# Patient Record
Sex: Female | Born: 1990 | Race: Black or African American | Hispanic: No | Marital: Single | Smoking: Never smoker
Health system: Southern US, Community
[De-identification: ages and names within clinical notes are randomized; demographics above are authoritative.]

---

## 2018-10-04 ENCOUNTER — Ambulatory Visit
Admission: RE | Admit: 2018-10-04 | Discharge: 2018-10-04 | Disposition: A | Discharge status: Home / Self Care | Payer: BC Managed Care – PPO | Source: Ambulatory Visit | Attending: Family Medicine | Admitting: Family Medicine

## 2018-10-04 ENCOUNTER — Other Ambulatory Visit: Payer: Self-pay | Admitting: Family Medicine

## 2018-10-04 DIAGNOSIS — M542 Cervicalgia: Secondary | ICD-10-CM

## 2019-12-26 ENCOUNTER — Other Ambulatory Visit: Payer: Self-pay | Admitting: Neurology

## 2019-12-26 DIAGNOSIS — R4189 Other symptoms and signs involving cognitive functions and awareness: Secondary | ICD-10-CM

## 2019-12-31 ENCOUNTER — Other Ambulatory Visit: Payer: Self-pay

## 2019-12-31 ENCOUNTER — Ambulatory Visit
Admission: RE | Admit: 2019-12-31 | Discharge: 2019-12-31 | Disposition: A | Discharge status: Home / Self Care | Payer: BC Managed Care – PPO | Source: Ambulatory Visit | Attending: Neurology | Admitting: Neurology

## 2019-12-31 DIAGNOSIS — R4189 Other symptoms and signs involving cognitive functions and awareness: Secondary | ICD-10-CM | POA: Diagnosis present

## 2020-06-19 IMAGING — MR MR HEAD W/O CM
8 of 12 series · 27 of 48 positions shown · non-contrast
Comparison: No pertinent prior studies available for comparison.

CLINICAL DATA: Fei of altered cognition. Additional history
provided: Patient reports spells where she "zones out." Posterior
right side headache, symptoms for about 1 year.

EXAM:
MRI HEAD WITHOUT CONTRAST
TECHNIQUE: Multiplanar, multiecho pulse sequences of the brain and surrounding
structures were obtained without intravenous contrast.

[Series 5: T1 · sagittal · 5.0mm · 0.62mm/px · 1 of 21 slices shown (1 of 2)]
[im 1/21]
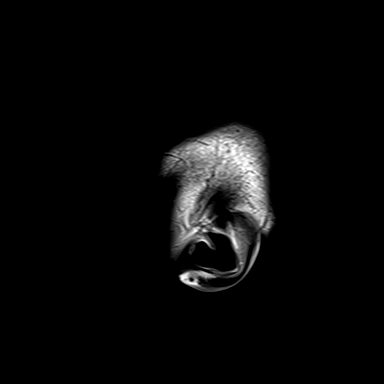

[Series 6: ax dwi_tracew · axial · 3.0mm · 0.60mm/px · z∈[-53,+96]mm · 3 of 48 slices shown]
[im 1/48]
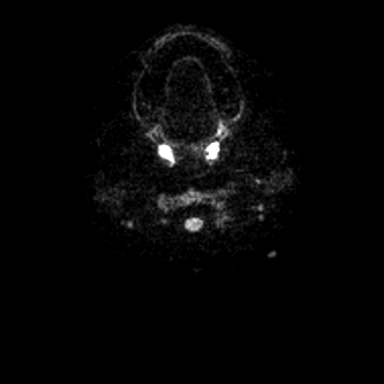
[im 24/48]
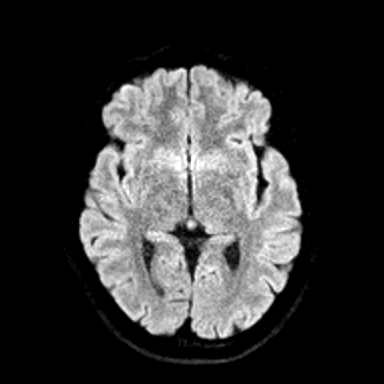
[im 48/48]
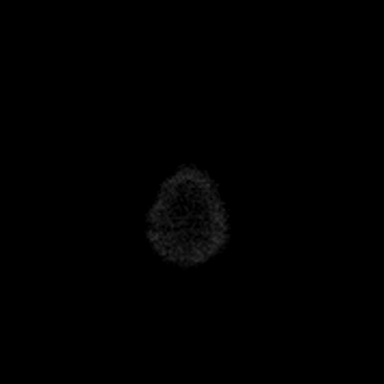

[Series 7: ax dwi_adc · axial · 3.0mm · 0.60mm/px · z∈[-53,+45]mm · 3 of 48 slices shown]
[im 1/48]
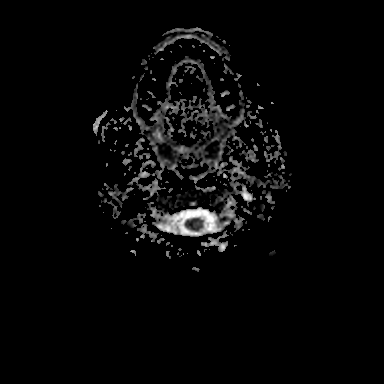
[im 16/48]
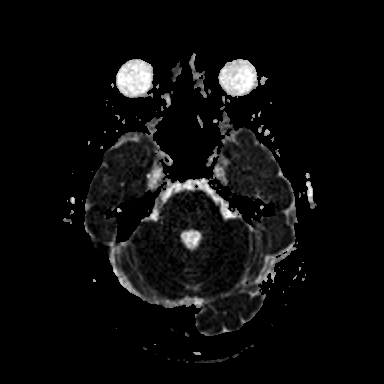
[im 32/48]
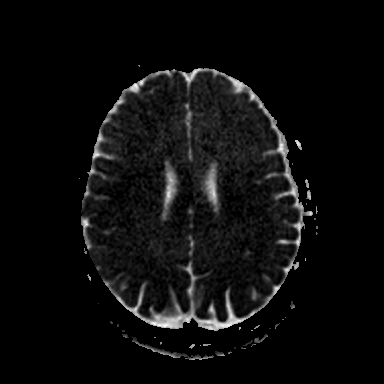

[Series 10: T2 · axial · 5.0mm · 0.53mm/px · z∈[-51,+98]mm · 2 of 27 slices shown (1 of 2)]
[im 1/27]
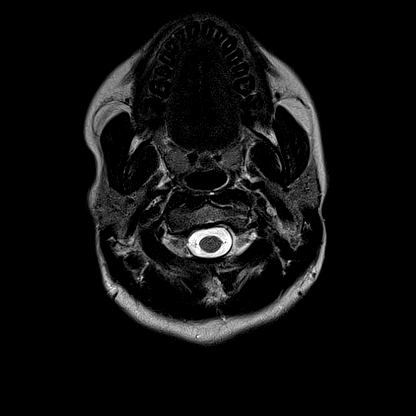
[im 27/27]
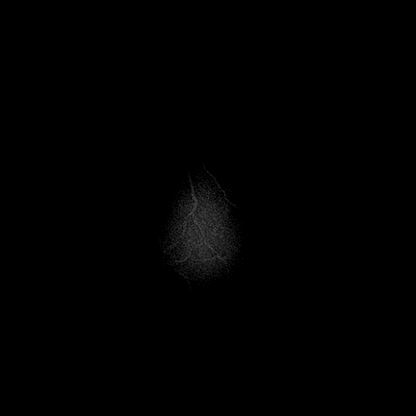

[Series 15: FLAIR · axial · 3.0mm · 0.53mm/px · z∈[-57,+98]mm · 4 of 55 slices shown (1 of 2)]
[im 1/55]
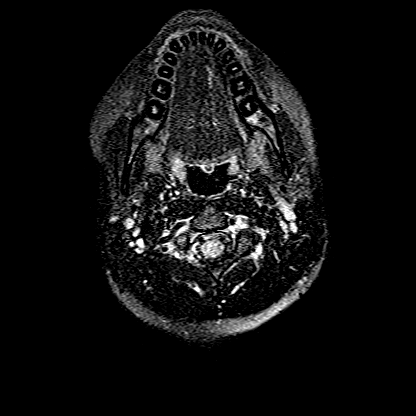
[im 19/55]
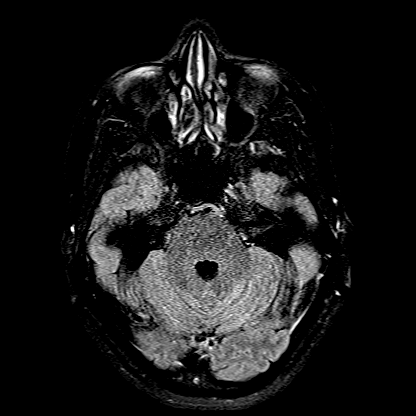
[im 37/55]
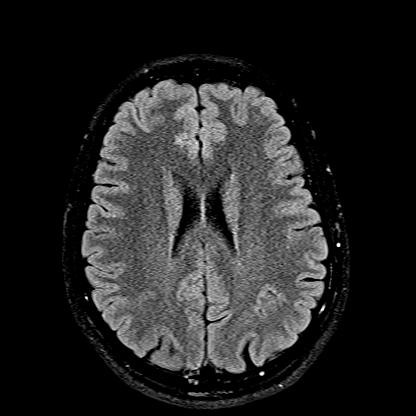
[im 55/55]
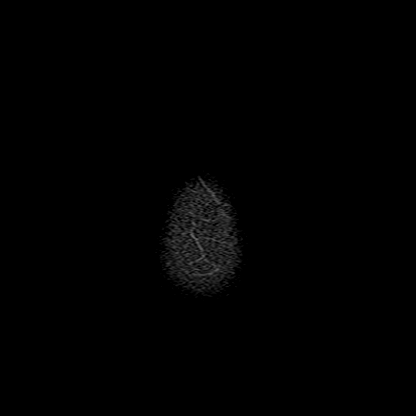

[Series 16: T1 · axial · 1.0mm · 0.98mm/px · z∈[-62,+104]mm · 8 of 174 slices shown (2 of 2)]
[im 1/174]
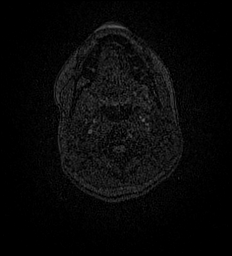
[im 29/174]
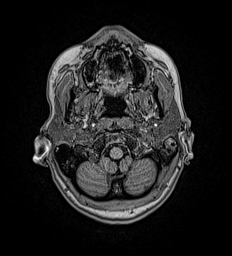
[im 58/174]
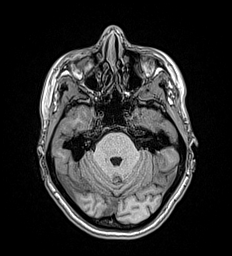
[im 73/174]
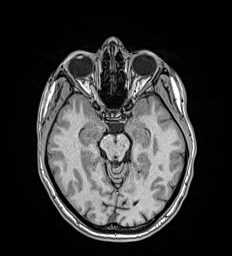
[im 101/174]
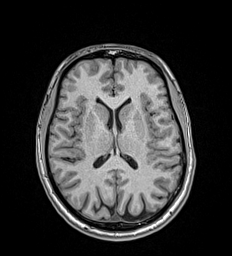
[im 116/174]
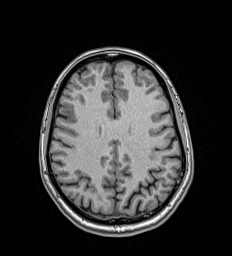
[im 145/174]
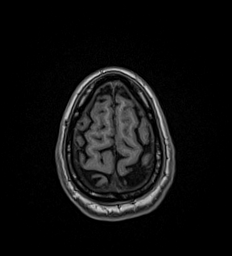
[im 174/174]
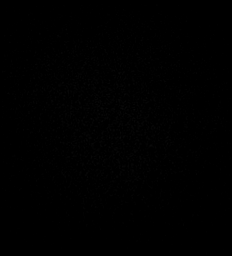

[Series 17: T2 · coronal · 3.0mm · 0.23mm/px · 3 of 35 slices shown (2 of 2)]
[im 1/35]
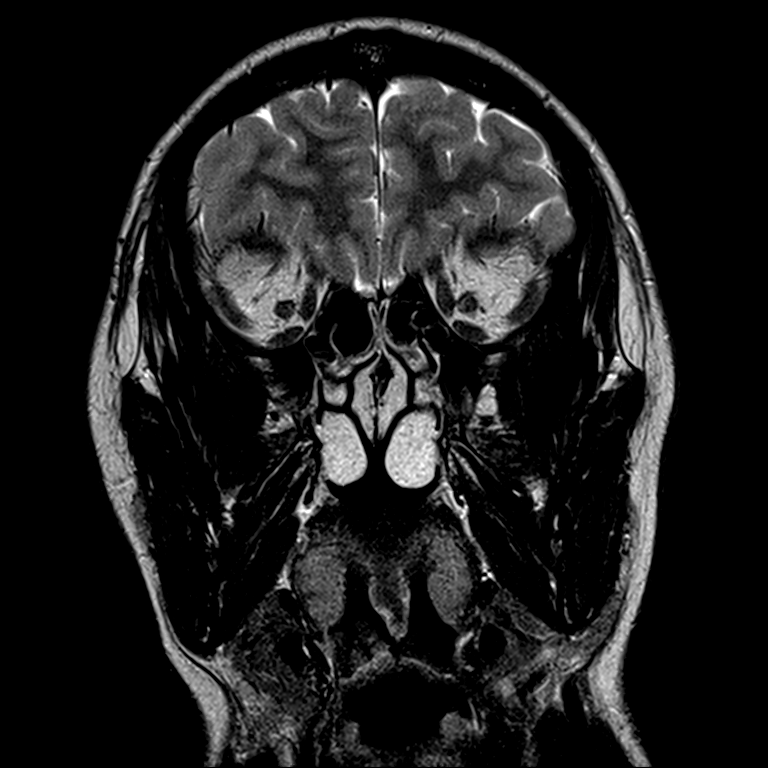
[im 18/35]
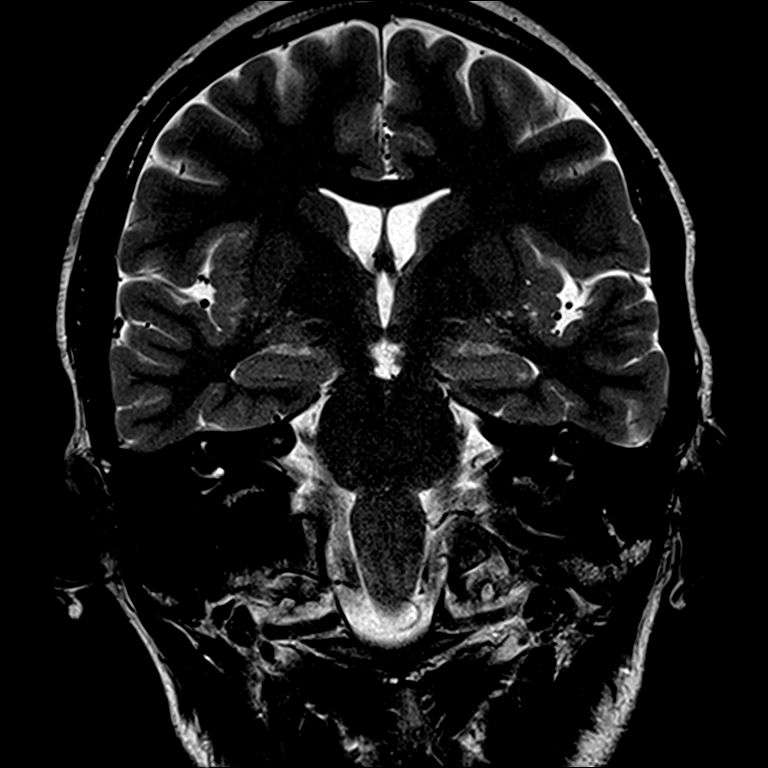
[im 35/35]
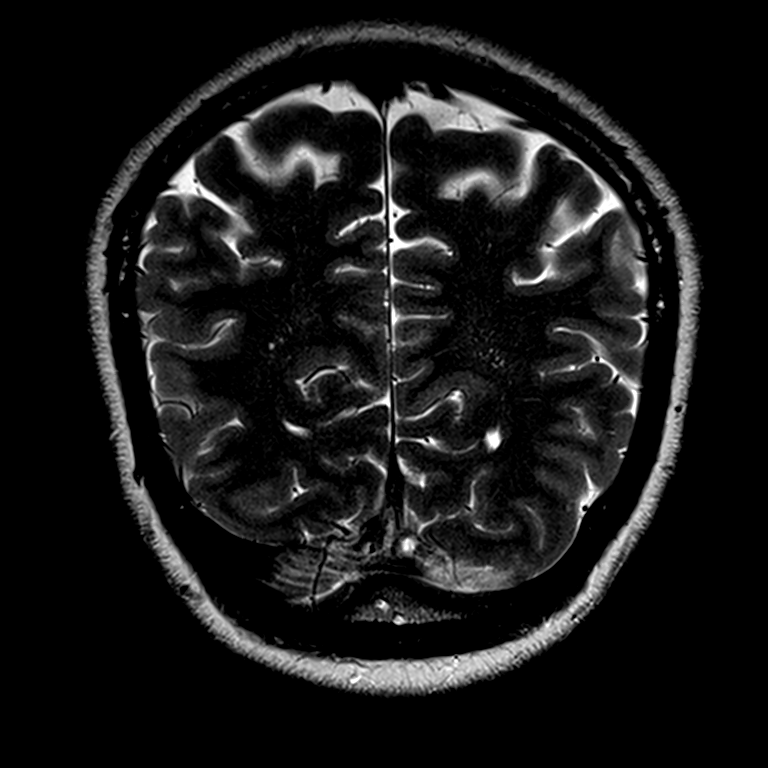

[Series 18: FLAIR · coronal · 3.0mm · 0.35mm/px · 3 of 35 slices shown (2 of 2)]
[im 1/35]
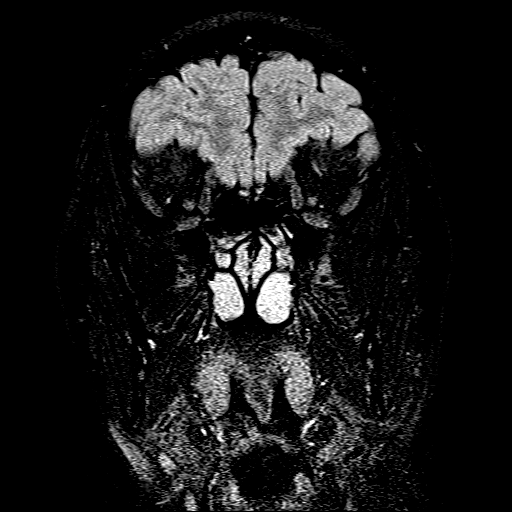
[im 18/35]
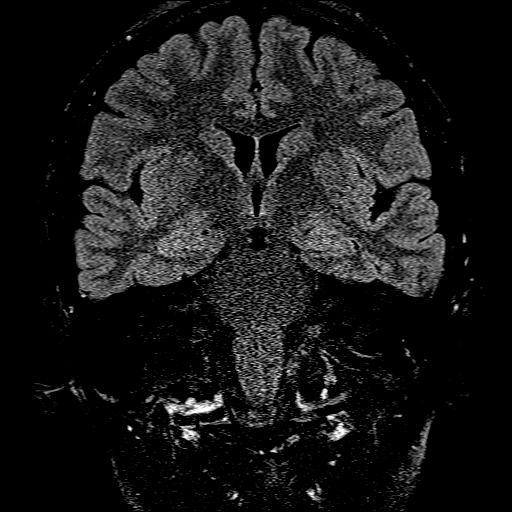
[im 35/35]
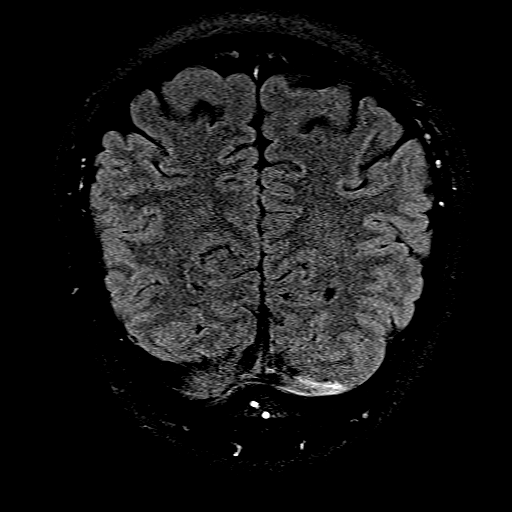

[27 of 48 positions shown; findings below may reference images not displayed]

FINDINGS: Brain:

There is no evidence of acute infarct.

No evidence of intracranial mass.

No midline shift or extra-axial fluid collection.

No chronic intracranial blood products.

No focal parenchymal signal abnormality is identified. The
hippocampi are symmetric in size and signal.

Cerebral volume is normal for age.

Vascular: Flow voids maintained within the proximal large arterial
vessels.

Skull and upper cervical spine: No focal marrow lesion

Sinuses/Orbits: Visualized orbits demonstrate no acute abnormality.
Minimal ethmoid sinus mucosal thickening. No significant mastoid
effusion.
IMPRESSION: Normal MRI appearance of the brain.

No evidence of acute intracranial abnormality.

No specific seizure focus is identified.

## 2021-11-17 ENCOUNTER — Encounter: Payer: Self-pay | Admitting: Emergency Medicine

## 2021-11-17 ENCOUNTER — Emergency Department
Admission: EM | Admit: 2021-11-17 | Discharge: 2021-11-17 | Disposition: A | Discharge status: Home / Self Care | Payer: BC Managed Care – PPO | Attending: Emergency Medicine | Admitting: Emergency Medicine

## 2021-11-17 ENCOUNTER — Emergency Department: Payer: BC Managed Care – PPO

## 2021-11-17 ENCOUNTER — Other Ambulatory Visit: Payer: Self-pay

## 2021-11-17 DIAGNOSIS — S46811A Strain of other muscles, fascia and tendons at shoulder and upper arm level, right arm, initial encounter: Secondary | ICD-10-CM

## 2021-11-17 DIAGNOSIS — Y92410 Unspecified street and highway as the place of occurrence of the external cause: Secondary | ICD-10-CM | POA: Diagnosis not present

## 2021-11-17 DIAGNOSIS — S46911A Strain of unspecified muscle, fascia and tendon at shoulder and upper arm level, right arm, initial encounter: Secondary | ICD-10-CM | POA: Insufficient documentation

## 2021-11-17 DIAGNOSIS — S4991XA Unspecified injury of right shoulder and upper arm, initial encounter: Secondary | ICD-10-CM | POA: Diagnosis present
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
## 2021-11-17 NOTE — Discharge Instructions (Signed)
Take naproxen 500mg  twice a day for the next 5 days to control pain and inflammation.  Avoid stretching or strenuous activity of the injured area tomorrow.  After that, you can begin gentle stretching and range-of-motion exercises of the right shoulder and neck to help get back to normal.
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
## 2022-12-24 ENCOUNTER — Emergency Department
Admission: EM | Admit: 2022-12-24 | Discharge: 2022-12-24 | Disposition: A | Discharge status: Home / Self Care | Payer: BC Managed Care – PPO | Attending: Emergency Medicine | Admitting: Emergency Medicine

## 2022-12-24 ENCOUNTER — Encounter: Payer: Self-pay | Admitting: Emergency Medicine

## 2022-12-24 DIAGNOSIS — J111 Influenza due to unidentified influenza virus with other respiratory manifestations: Secondary | ICD-10-CM | POA: Diagnosis not present

## 2022-12-24 DIAGNOSIS — R110 Nausea: Secondary | ICD-10-CM | POA: Diagnosis present

## 2022-12-24 LAB — CBC WITH DIFFERENTIAL/PLATELET
Abs Immature Granulocytes: 0.02 10*3/uL (ref 0.00–0.07)
Basophils Absolute: 0 10*3/uL (ref 0.0–0.1)
Basophils Relative: 0 %
Eosinophils Absolute: 0 10*3/uL (ref 0.0–0.5)
Eosinophils Relative: 0 %
HCT: 41.5 % (ref 36.0–46.0)
Hemoglobin: 13.2 g/dL (ref 12.0–15.0)
Immature Granulocytes: 0 %
Lymphocytes Relative: 16 %
Lymphs Abs: 1 10*3/uL (ref 0.7–4.0)
MCH: 26.9 pg (ref 26.0–34.0)
MCHC: 31.8 g/dL (ref 30.0–36.0)
MCV: 84.7 fL (ref 80.0–100.0)
Monocytes Absolute: 0.7 10*3/uL (ref 0.1–1.0)
Monocytes Relative: 10 %
Neutro Abs: 4.7 10*3/uL (ref 1.7–7.7)
Neutrophils Relative %: 74 %
Platelets: 167 10*3/uL (ref 150–400)
RBC: 4.9 MIL/uL (ref 3.87–5.11)
RDW: 12.8 % (ref 11.5–15.5)
WBC: 6.4 10*3/uL (ref 4.0–10.5)
nRBC: 0 % (ref 0.0–0.2)

## 2022-12-24 LAB — COMPREHENSIVE METABOLIC PANEL
ALT: 16 U/L (ref 0–44)
AST: 26 U/L (ref 15–41)
Albumin: 4.4 g/dL (ref 3.5–5.0)
Alkaline Phosphatase: 63 U/L (ref 38–126)
Anion gap: 10 (ref 5–15)
BUN: 10 mg/dL (ref 6–20)
CO2: 24 mmol/L (ref 22–32)
Calcium: 9.4 mg/dL (ref 8.9–10.3)
Chloride: 103 mmol/L (ref 98–111)
Creatinine, Ser: 0.66 mg/dL (ref 0.44–1.00)
GFR, Estimated: >60 mL/min (ref >60)
Glucose, Bld: 117 mg/dL — ABNORMAL HIGH (ref 70–99)
Potassium: 3.7 mmol/L (ref 3.5–5.1)
Sodium: 137 mmol/L (ref 135–145)
Total Bilirubin: 0.8 mg/dL (ref 0.3–1.2)
Total Protein: 7.8 g/dL (ref 6.5–8.1)

## 2022-12-24 LAB — HCG, QUANTITATIVE, PREGNANCY: hCG, Beta Chain, Quant, S: <1 m[IU]/mL (ref <5)

## 2022-12-24 MED ORDER — ONDANSETRON 4 MG PO TBDP: 4.0000 mg | ORAL_TABLET | Freq: Three times a day (TID) | ORAL | 0 refills | Status: DC | PRN

## 2022-12-24 MED ORDER — ONDANSETRON 4 MG PO TBDP
4.0000 mg | ORAL_TABLET | Freq: Once | ORAL | Status: AC
  Administered 2022-12-24: 4 mg via ORAL
  Filled 2022-12-24: qty 1

## 2022-12-24 NOTE — ED Triage Notes (Addendum)
Pt presents via POV with complaints of nausea since testing positive for the flu yesterday at her PCP. Pt states feeling "ill" all yesterday and has the sensation of needing to vomit but is only spitting up. Of note, pt has had loose dark stools - takes an iron supplement. Denies AP, CP or SOB.

## 2022-12-24 NOTE — ED Notes (Signed)
Patient discharged to home per MD order. Patient in stable condition, and deemed medically cleared by ED provider for discharge. Discharge instructions reviewed with patient/family using "Teach Back"; verbalized understanding of medication education and administration, and information about follow-up care. Denies further concerns. ° °

## 2022-12-24 NOTE — ED Provider Notes (Signed)
   [facility] Provider Note    Event Date/Time   First MD Initiated Contact with Patient 12/24/22 [phone]     (approximate)   History   Influenza (/.)   HPI  Carrie Welch is a 31 y.o. female who reports she tested positive for influenza yesterday, she is having nausea overnight, some loose stools.  No abdominal pain.     Physical Exam   Triage Vital Signs: ED Triage Vitals  Enc Vitals Group     BP 12/24/22 0528 107/66     Pulse Rate 12/24/22 0528 (!) 105     Resp 12/24/22 0528 19     Temp 12/24/22 0528 98.8 F (37.1 C)     Temp Source 12/24/22 0528 Oral     SpO2 12/24/22 0528 97 %     Weight 12/24/22 0539 59.9 kg (132 lb)     Height 12/24/22 0539 1.702 m (5\' 7" )     Head Circumference --      Peak Flow --      Pain Score --      Pain Loc --      Pain Edu? --      Excl. in GC? --     Most recent vital signs: Vitals:   12/24/22 0528 12/24/22 0810  BP: 107/66 102/87  Pulse: (!) 105 95  Resp: 19 18  Temp: 98.8 F (37.1 C) 99.1 F (37.3 C)  SpO2: 97% 97%     General: Awake, no distress.  CV:  Good peripheral perfusion.  Resp:  Normal effort.  Abd:  No distention.  Other:     ED Results / Procedures / Treatments   Labs (all labs ordered are listed, but only abnormal results are displayed) Labs Reviewed  COMPREHENSIVE METABOLIC PANEL - Abnormal; Notable for the following components:      Result Value   Glucose, Bld 117 (*)    All other components within normal limits  CBC WITH DIFFERENTIAL/PLATELET  HCG, QUANTITATIVE, PREGNANCY     EKG     RADIOLOGY     PROCEDURES:  Critical Care performed:   Procedures   MEDICATIONS ORDERED IN ED: Medications  ondansetron (ZOFRAN-ODT) disintegrating tablet 4 mg (4 mg Oral Given 12/24/22 0544)     IMPRESSION / MDM / ASSESSMENT AND PLAN / ED COURSE  I reviewed the triage vital signs and the nursing notes. Patient's presentation is most consistent with acute illness /  injury with system symptoms.  Patient presents with nausea, vomiting in the setting of recent influenza diagnosis  Lab work reviewed and is quite reassuring, suspect nausea is related to the influenza as it is a common symptom.  Will treat with ODT Zofran, no indication for admission, outpatient follow-up,      FINAL CLINICAL IMPRESSION(S) / ED DIAGNOSES   Final diagnoses:  Influenza     Rx / DC Orders   ED Discharge Orders          Ordered    ondansetron (ZOFRAN-ODT) 4 MG disintegrating tablet  Every 8 hours PRN        12/24/22 0820             Note:  This document was prepared using Dragon voice recognition software and may include unintentional dictation errors.   12/26/22, MD 12/24/22 1535

## 2023-08-27 ENCOUNTER — Encounter: Payer: Self-pay | Admitting: Emergency Medicine

## 2023-08-27 ENCOUNTER — Other Ambulatory Visit: Payer: Self-pay

## 2023-08-27 ENCOUNTER — Emergency Department
Admission: EM | Admit: 2023-08-27 | Discharge: 2023-08-27 | Disposition: A | Discharge status: Home / Self Care | Payer: BC Managed Care – PPO | Source: Home / Self Care | Attending: Emergency Medicine | Admitting: Emergency Medicine

## 2023-08-27 ENCOUNTER — Emergency Department: Payer: BC Managed Care – PPO

## 2023-08-27 ENCOUNTER — Other Ambulatory Visit: Payer: BC Managed Care – PPO

## 2023-08-27 DIAGNOSIS — Y92410 Unspecified street and highway as the place of occurrence of the external cause: Secondary | ICD-10-CM | POA: Diagnosis not present

## 2023-08-27 DIAGNOSIS — S40012A Contusion of left shoulder, initial encounter: Secondary | ICD-10-CM | POA: Insufficient documentation

## 2023-08-27 DIAGNOSIS — S4992XA Unspecified injury of left shoulder and upper arm, initial encounter: Secondary | ICD-10-CM | POA: Diagnosis present

## 2023-08-27 LAB — URINALYSIS, ROUTINE W REFLEX MICROSCOPIC
Bilirubin Urine: NEGATIVE
Glucose, UA: NEGATIVE mg/dL
Hgb urine dipstick: NEGATIVE
Ketones, ur: NEGATIVE mg/dL
Leukocytes,Ua: NEGATIVE
Nitrite: NEGATIVE
Protein, ur: NEGATIVE mg/dL
Specific Gravity, Urine: 1.012 (ref 1.005–1.030)
pH: 6 (ref 5.0–8.0)

## 2023-08-27 MED ORDER — METHOCARBAMOL 500 MG PO TABS: 500.0000 mg | ORAL_TABLET | Freq: Four times a day (QID) | ORAL | 0 refills | Status: DC

## 2023-08-27 MED ORDER — MELOXICAM 15 MG PO TABS: 15.0000 mg | ORAL_TABLET | Freq: Every day | ORAL | 0 refills | Status: DC

## 2023-08-27 MED ORDER — METHOCARBAMOL 500 MG PO TABS
1000.0000 mg | ORAL_TABLET | Freq: Once | ORAL | Status: AC
  Administered 2023-08-27: 1000 mg via ORAL
  Filled 2023-08-27: qty 2

## 2023-08-27 MED ORDER — MELOXICAM 7.5 MG PO TABS
15.0000 mg | ORAL_TABLET | Freq: Once | ORAL | Status: AC
  Administered 2023-08-27: 15 mg via ORAL
  Filled 2023-08-27: qty 2
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
## 2023-09-21 ENCOUNTER — Emergency Department: Payer: BC Managed Care – PPO

## 2023-09-21 ENCOUNTER — Emergency Department
Admission: EM | Admit: 2023-09-21 | Discharge: 2023-09-21 | Disposition: A | Discharge status: Home / Self Care | Payer: BC Managed Care – PPO | Attending: Emergency Medicine | Admitting: Emergency Medicine

## 2023-09-21 ENCOUNTER — Other Ambulatory Visit: Payer: Self-pay

## 2023-09-21 DIAGNOSIS — R569 Unspecified convulsions: Secondary | ICD-10-CM

## 2023-09-21 LAB — CBC WITH DIFFERENTIAL/PLATELET
Abs Immature Granulocytes: 0.04 10*3/uL (ref 0.00–0.07)
Basophils Absolute: 0 10*3/uL (ref 0.0–0.1)
Basophils Relative: 1 %
Eosinophils Absolute: 0 10*3/uL (ref 0.0–0.5)
Eosinophils Relative: 0 %
HCT: 43.6 % (ref 36.0–46.0)
Hemoglobin: 13.6 g/dL (ref 12.0–15.0)
Immature Granulocytes: 1 %
Lymphocytes Relative: 41 %
Lymphs Abs: 3.3 10*3/uL (ref 0.7–4.0)
MCH: 27.7 pg (ref 26.0–34.0)
MCHC: 31.2 g/dL (ref 30.0–36.0)
MCV: 88.8 fL (ref 80.0–100.0)
Monocytes Absolute: 0.5 10*3/uL (ref 0.1–1.0)
Monocytes Relative: 7 %
Neutro Abs: 4.1 10*3/uL (ref 1.7–7.7)
Neutrophils Relative %: 50 %
Platelets: 207 10*3/uL (ref 150–400)
RBC: 4.91 MIL/uL (ref 3.87–5.11)
RDW: 13.1 % (ref 11.5–15.5)
WBC: 8.1 10*3/uL (ref 4.0–10.5)
nRBC: 0 % (ref 0.0–0.2)

## 2023-09-21 LAB — BASIC METABOLIC PANEL
Anion gap: 15 (ref 5–15)
BUN: 12 mg/dL (ref 6–20)
CO2: 16 mmol/L — ABNORMAL LOW (ref 22–32)
Calcium: 8.7 mg/dL — ABNORMAL LOW (ref 8.9–10.3)
Chloride: 104 mmol/L (ref 98–111)
Creatinine, Ser: 0.8 mg/dL (ref 0.44–1.00)
GFR, Estimated: >60 mL/min (ref >60)
Glucose, Bld: 111 mg/dL — ABNORMAL HIGH (ref 70–99)
Potassium: 3.3 mmol/L — ABNORMAL LOW (ref 3.5–5.1)
Sodium: 135 mmol/L (ref 135–145)

## 2023-09-21 LAB — URINALYSIS, ROUTINE W REFLEX MICROSCOPIC
Bilirubin Urine: NEGATIVE
Glucose, UA: NEGATIVE mg/dL
Ketones, ur: NEGATIVE mg/dL
Leukocytes,Ua: NEGATIVE
Nitrite: NEGATIVE
Protein, ur: NEGATIVE mg/dL
Specific Gravity, Urine: 1.016 (ref 1.005–1.030)
pH: 5 (ref 5.0–8.0)

## 2023-09-21 LAB — MAGNESIUM: Magnesium: 2.2 mg/dL (ref 1.7–2.4)

## 2023-09-21 LAB — PREGNANCY, URINE: Preg Test, Ur: NEGATIVE

## 2023-09-21 MED ORDER — SODIUM CHLORIDE 0.9 % IV BOLUS
1000.0000 mL | Freq: Once | INTRAVENOUS | Status: AC
  Administered 2023-09-21: 1000 mL via INTRAVENOUS

## 2023-09-21 MED ORDER — DIPHENHYDRAMINE HCL 50 MG/ML IJ SOLN
25.0000 mg | Freq: Once | INTRAMUSCULAR | Status: AC
  Administered 2023-09-21: 25 mg via INTRAVENOUS
  Filled 2023-09-21: qty 1

## 2023-09-21 MED ORDER — GADOBUTROL 1 MMOL/ML IV SOLN
5.0000 mL | Freq: Once | INTRAVENOUS | Status: AC | PRN
  Administered 2023-09-21: 5 mL via INTRAVENOUS

## 2023-09-21 MED ORDER — ONDANSETRON HCL 4 MG/2ML IJ SOLN
4.0000 mg | Freq: Once | INTRAMUSCULAR | Status: AC
  Administered 2023-09-21: 4 mg via INTRAVENOUS
  Filled 2023-09-21: qty 2

## 2023-09-21 MED ORDER — DIAZEPAM 10 MG RE GEL: 10.0000 mg | Freq: Once | RECTAL | 0 refills | Status: AC

## 2023-09-21 MED ORDER — PROCHLORPERAZINE EDISYLATE 10 MG/2ML IJ SOLN
10.0000 mg | Freq: Once | INTRAMUSCULAR | Status: AC
  Administered 2023-09-21: 10 mg via INTRAVENOUS
  Filled 2023-09-21: qty 2

## 2023-09-21 MED ORDER — SODIUM CHLORIDE 0.9 % IV BOLUS: 1000.0000 mL | Freq: Once | INTRAVENOUS | Status: DC

## 2023-09-21 MED ORDER — LEVETIRACETAM 500 MG PO TABS: 500.0000 mg | ORAL_TABLET | Freq: Two times a day (BID) | ORAL | 1 refills | Status: AC

## 2023-09-21 MED ORDER — LEVETIRACETAM IN NACL 1000 MG/100ML IV SOLN
1000.0000 mg | Freq: Once | INTRAVENOUS | Status: AC
  Administered 2023-09-21: 1000 mg via INTRAVENOUS
  Filled 2023-09-21: qty 100

## 2023-09-21 NOTE — ED Provider Notes (Signed)
[facility] Provider Note    Event Date/Time   First MD Initiated Contact with Patient 09/21/23 1114     (approximate)   History   Seizures   HPI  Carrie Welch is a 32 y.o. female with past medical history of seizure-like episodes here with witnessed generalized seizure.  The patient is a Runner, broadcasting/film/video for first grade students.  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  She began to feel an abnormal pressure-like sensation in her head as well as nausea.  This is what she has had prior to her seizure-like episodes in the past.  However, she then lost consciousness and does not memory thing until waking up with EMS.  She has never had a generalized seizure.  Her typical seizures are just brief episodes where she does not remember what she was doing but she does not lose consciousness, bowel control, or bite her tongue.     Physical Exam   Triage Vital Signs: ED Triage Vitals [09/21/23 1103]  Encounter Vitals Group     BP (!) 113/55     Systolic BP Percentile      Diastolic BP Percentile      Pulse Rate (!) 120     Resp 18     Temp 97.6 F (36.4 C)     Temp Source Oral     SpO2 100 %     Weight 132 lb 0.9 oz (59.9 kg)     Height 5\' 7"  (1.702 m)     Head Circumference      Peak Flow      Pain Score 5     Pain Loc      Pain Education      Exclude from Growth Chart     Most recent vital signs: Vitals:   09/21/23 1430 09/21/23 1530  BP: 104/65 109/70  Pulse: 99 85  Resp: 18 16  Temp:    SpO2: 100% 100%     General: Awake, no distress.  CV:  Good peripheral perfusion.  Resp:  Normal work of breathing.  Abd:  No distention. No tenderness. Other:  CNII-XII intact. Face symmetric. Speech is normal. Pt AOx4. Strength 5/5 bl UE and LE. Normal sensation to light touch bl UE and LE. No dysmetria.   ED Results / Procedures / Treatments   Labs (all labs ordered are listed, but only abnormal results are displayed) Labs Reviewed  BASIC  METABOLIC PANEL - Abnormal; Notable for the following components:      Result Value   Potassium 3.3 (*)    CO2 16 (*)    Glucose, Bld 111 (*)    Calcium 8.7 (*)    All other components within normal limits  URINALYSIS, ROUTINE W REFLEX MICROSCOPIC - Abnormal; Notable for the following components:   Color, Urine YELLOW (*)    APPearance HAZY (*)    Hgb urine dipstick SMALL (*)    Bacteria, UA RARE (*)    All other components within normal limits  CBC WITH DIFFERENTIAL/PLATELET  MAGNESIUM  PREGNANCY, URINE     EKG Sinus tachycardia, VR 113. PR 182, QRS 89, QTc 515. No acute ST elevations or depressions. No ischemia or infarct.   RADIOLOGY MR Brain: Normal   I also independently reviewed and agree with radiologist interpretations.   PROCEDURES:  Critical Care performed: No    MEDICATIONS ORDERED IN ED: Medications  sodium chloride 0.9 % bolus 1,000 mL (1,000 mLs Intravenous Not Given 09/21/23  1323)  sodium chloride 0.9 % bolus 1,000 mL (0 mLs Intravenous Stopped 09/21/23 1553)  levETIRAcetam (KEPPRA) IVPB 1000 mg/100 mL premix (0 mg Intravenous Stopped 09/21/23 1322)  prochlorperazine (COMPAZINE) injection 10 mg (10 mg Intravenous Given 09/21/23 1313)  diphenhydrAMINE (BENADRYL) injection 25 mg (25 mg Intravenous Given 09/21/23 1313)  ondansetron (ZOFRAN) injection 4 mg (4 mg Intravenous Given 09/21/23 1214)  gadobutrol (GADAVIST) 1 MMOL/ML injection 5 mL (5 mLs Intravenous Contrast Given 09/21/23 1240)     IMPRESSION / MDM / ASSESSMENT AND PLAN / ED COURSE  I reviewed the triage vital signs and the nursing notes.                              Differential diagnosis includes, but is not limited to, seizure disorder, now with GTC seizure, partial complex seizures, PNES, anxiety, arrhythmia, vasovagal or convulsive syncope  Patient's presentation is most consistent with acute presentation with potential threat to life or bodily function.  The patient is on the cardiac  monitor to evaluate for evidence of arrhythmia and/or significant heart rate changes   32 yo F with PMHx prior seizure-like episodes here with GTC seizure activity. She is now back to her baseline. Interestingly, she has been previously worked up by Neurology with normal EEGs, MR wo contrast. However, this is the first generalized activity she has had. No focal deficits. No apparent triggers. Discussed with Neurology via telephone, who recommends starting Keppra and obtaining MRI WWO given change in character and the fact that she has not had a contrasted scan.  MR Brain WWO shows no acute abnormality. Pt remains at baseline. We had a long discussion re: seizure precautions, and will place her on keppra with outpt Neuro follow-up. Diastat given for home as well.  FINAL CLINICAL IMPRESSION(S) / ED DIAGNOSES   Final diagnoses:  Seizure (HCC)     Rx / DC Orders   ED Discharge Orders          Ordered    levETIRAcetam (KEPPRA) 500 MG tablet  2 times daily        09/21/23 1427    diazepam (DIASTAT ACUDIAL) 10 MG GEL   Once        09/21/23 1427             Note:  This document was prepared using Dragon voice recognition software and may include unintentional dictation errors.   Shaune Pollack, MD 09/21/23 2009

## 2023-09-21 NOTE — ED Notes (Signed)
PT at [facility]

## 2023-09-21 NOTE — ED Triage Notes (Signed)
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 Pt states HX of seizures but denies taking medications for seizures. EMS states pt had a grand-mal today but pt states they are most often absent seizure. Pt denies any biting tongue or urinating on self.   Pt c/o of headache, pt unsure of hitting head. Pt is A&Ox4.

## 2023-09-21 NOTE — Discharge Instructions (Addendum)
You have been seen in the emergency department today for a likely seizure.  Your workup today including labs are within normal limits.  Please follow up with your doctor as soon as possible regarding today's emergency department visit and your likely seizure.  You will also need to follow up with a neurologist as soon as possible, please call for appointment.  If you have been prescribed a medication for your seizures, please take this medication as prescribed.  As we have discussed it is very important that you DO NOT drive until you have been seen and cleared by your neurologist.  Please drink plenty of fluids, get plenty of sleep and avoid any alcohol or drug use.  Return to the emergency department if you have any further seizures, develop any weakness/numbness of any arm/leg, confusion, slurred speech, or sudden/severe headache.

## 2023-09-21 NOTE — ED Notes (Signed)
MRI called pt about screening questions.

## 2023-09-21 NOTE — ED Notes (Signed)
MRI called this RN stating pt was vomiting and they could not do scan. MD made aware and Zofran was ordered. This RN walked to MRI and administered ordered medication. MRI proceeded with scan.

## 2024-07-18 ENCOUNTER — Other Ambulatory Visit: Payer: Self-pay

## 2024-07-18 ENCOUNTER — Emergency Department

## 2024-07-18 ENCOUNTER — Emergency Department
Admission: EM | Admit: 2024-07-18 | Discharge: 2024-07-18 | Disposition: A | Discharge status: Home / Self Care | Attending: Emergency Medicine | Admitting: Emergency Medicine

## 2024-07-18 DIAGNOSIS — S6992XA Unspecified injury of left wrist, hand and finger(s), initial encounter: Secondary | ICD-10-CM | POA: Diagnosis present

## 2024-07-18 DIAGNOSIS — Y92410 Unspecified street and highway as the place of occurrence of the external cause: Secondary | ICD-10-CM | POA: Diagnosis not present

## 2024-07-18 DIAGNOSIS — S62145A Nondisplaced fracture of body of hamate [unciform] bone, left wrist, initial encounter for closed fracture: Secondary | ICD-10-CM | POA: Diagnosis not present

## 2024-07-18 DIAGNOSIS — S52615A Nondisplaced fracture of left ulna styloid process, initial encounter for closed fracture: Secondary | ICD-10-CM | POA: Diagnosis not present

## 2024-07-18 DIAGNOSIS — R569 Unspecified convulsions: Secondary | ICD-10-CM

## 2024-07-18 DIAGNOSIS — M25512 Pain in left shoulder: Secondary | ICD-10-CM | POA: Insufficient documentation

## 2024-07-18 DIAGNOSIS — G40909 Epilepsy, unspecified, not intractable, without status epilepticus: Secondary | ICD-10-CM | POA: Insufficient documentation

## 2024-07-18 DIAGNOSIS — S0990XD Unspecified injury of head, subsequent encounter: Secondary | ICD-10-CM | POA: Diagnosis present

## 2024-07-18 LAB — BASIC METABOLIC PANEL WITH GFR
Anion gap: 8 (ref 5–15)
BUN: 10 mg/dL (ref 6–20)
CO2: 23 mmol/L (ref 22–32)
Calcium: 9.5 mg/dL (ref 8.9–10.3)
Chloride: 104 mmol/L (ref 98–111)
Creatinine, Ser: 0.71 mg/dL (ref 0.44–1.00)
GFR, Estimated: >60 mL/min (ref >60)
Glucose, Bld: 95 mg/dL (ref 70–99)
Potassium: 4.1 mmol/L (ref 3.5–5.1)
Sodium: 135 mmol/L (ref 135–145)

## 2024-07-18 LAB — CBC
HCT: 42.7 % (ref 36.0–46.0)
Hemoglobin: 13.4 g/dL (ref 12.0–15.0)
MCH: 27.8 pg (ref 26.0–34.0)
MCHC: 31.4 g/dL (ref 30.0–36.0)
MCV: 88.6 fL (ref 80.0–100.0)
Platelets: 207 K/uL (ref 150–400)
RBC: 4.82 MIL/uL (ref 3.87–5.11)
RDW: 13.2 % (ref 11.5–15.5)
WBC: 7.7 K/uL (ref 4.0–10.5)
nRBC: 0 % (ref 0.0–0.2)

## 2024-07-18 MED ORDER — HYDROCODONE-ACETAMINOPHEN 5-325 MG PO TABS
1.0000 | ORAL_TABLET | Freq: Once | ORAL | Status: AC
  Administered 2024-07-18: 1 via ORAL
  Filled 2024-07-18: qty 1

## 2024-07-18 MED ORDER — LAMOTRIGINE 25 MG PO TABS
150.0000 mg | ORAL_TABLET | Freq: Once | ORAL | Status: AC
  Administered 2024-07-18: 150 mg via ORAL
  Filled 2024-07-18: qty 2
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
## 2024-07-19 ENCOUNTER — Emergency Department
Admission: EM | Admit: 2024-07-19 | Discharge: 2024-07-19 | Disposition: A | Discharge status: Home / Self Care | Attending: Emergency Medicine | Admitting: Emergency Medicine

## 2024-07-19 DIAGNOSIS — S0990XA Unspecified injury of head, initial encounter: Secondary | ICD-10-CM

## 2024-07-19 MED ORDER — IBUPROFEN 800 MG PO TABS
800.0000 mg | ORAL_TABLET | Freq: Three times a day (TID) | ORAL | 0 refills | Status: AC | PRN
Start: 2024-07-19 — End: ?

## 2024-07-19 MED ORDER — ONDANSETRON 4 MG PO TBDP: 4.0000 mg | ORAL_TABLET | Freq: Four times a day (QID) | ORAL | 0 refills | Status: AC | PRN

## 2024-07-19 MED ORDER — IBUPROFEN 800 MG PO TABS
800.0000 mg | ORAL_TABLET | Freq: Once | ORAL | Status: AC
  Administered 2024-07-19: 800 mg via ORAL
  Filled 2024-07-19: qty 1

## 2024-07-19 MED ORDER — HYDROCODONE-ACETAMINOPHEN 5-325 MG PO TABS
2.0000 | ORAL_TABLET | Freq: Once | ORAL | Status: AC
  Administered 2024-07-19: 2 via ORAL
  Filled 2024-07-19: qty 2

## 2024-07-19 MED ORDER — HYDROCODONE-ACETAMINOPHEN 5-325 MG PO TABS: 2.0000 | ORAL_TABLET | Freq: Three times a day (TID) | ORAL | 0 refills | Status: AC | PRN
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
## 2024-07-19 NOTE — Discharge Instructions (Signed)
You have had a head injury resulting in a concussion.  A concussion is a clinical diagnosis and not seen on imaging (CT, MRI).  Please avoid alcohol, sedatives for the next week.  Please rest and drink plenty of water.  We recommend that you avoid any activity that may lead to another head injury for at least 1 week or until your symptoms have completely resolved.  We also recommend "brain rest" to ensure the best possible long term outcomes - please avoid TV, cell phones, tablets, computers as much as possible for the next 48 hours.   ? ? ?You are being provided a prescription for opiates (also known as narcotics) for pain control.  Opiates can be addictive and should only be used when absolutely necessary for pain control when other alternatives do not work.  We recommend you only use them for the recommended amount of time and only as prescribed.  Please do not take with other sedative medications or alcohol.  Please do not drive, operate machinery, make important decisions while taking opiates.  Please note that these medications can be addictive and have high abuse potential.  Patients can become addicted to narcotics after only taking them for a few days.  Please keep these medications locked away from children, teenagers or any family members with history of substance abuse.  Narcotic pain medicine may also make you constipated.  You may use over-the-counter medications such as MiraLAX, Colace to prevent constipation.  If you become constipated, you may use over-the-counter enemas as needed.  Itching and nausea are also common side effects of narcotic pain medication.  If you develop uncontrolled vomiting or a rash, please stop these medications and seek medical care. ? ?
